# Patient Record
Sex: Female | Born: 1990 | Race: Black or African American | Hispanic: No | Marital: Single | Smoking: Never smoker
Health system: Southern US, Community
[De-identification: ages and names within clinical notes are randomized; demographics above are authoritative.]

## PROBLEM LIST (undated history)

## (undated) DIAGNOSIS — E669 Obesity, unspecified: Secondary | ICD-10-CM

## (undated) DIAGNOSIS — O1490 Unspecified pre-eclampsia, unspecified trimester: Secondary | ICD-10-CM

## (undated) DIAGNOSIS — T7840XA Allergy, unspecified, initial encounter: Secondary | ICD-10-CM

## (undated) HISTORY — DX: Allergy, unspecified, initial encounter: T78.40XA

## (undated) HISTORY — DX: Obesity, unspecified: E66.9

## (undated) HISTORY — DX: Unspecified pre-eclampsia, unspecified trimester: O14.90

---
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
## 2011-08-26 ENCOUNTER — Ambulatory Visit (INDEPENDENT_AMBULATORY_CARE_PROVIDER_SITE_OTHER): Payer: 59 | Admitting: Family Medicine

## 2011-08-26 VITALS — BP 120/80 | HR 76 | Temp 98.3°F | Resp 16 | Ht 65.75 in | Wt 207.8 lb

## 2011-08-26 DIAGNOSIS — IMO0001 Reserved for inherently not codable concepts without codable children: Secondary | ICD-10-CM

## 2011-08-26 DIAGNOSIS — Z309 Encounter for contraceptive management, unspecified: Secondary | ICD-10-CM

## 2011-08-26 LAB — POCT URINE PREGNANCY: Preg Test, Ur: NEGATIVE

## 2011-08-26 MED ORDER — MEDROXYPROGESTERONE ACETATE 150 MG/ML IM SUSP
150.0000 mg | Freq: Once | INTRAMUSCULAR | Status: AC
  Administered 2011-08-26: 150 mg via INTRAMUSCULAR

## 2011-08-26 NOTE — Progress Notes (Signed)
     Patient Name: Melanie Harrington Date of Birth: 1990/09/29 Medical Record Number: [id number] Gender: female Date of Encounter: 08/26/2011  History of Present Illness:  Melanie Harrington is a 21 y.o. very pleasant female patient who presents with the following:  Would like to start back on contraception.  She was on Depo- provera in the past- her last shot was in August of 2012.  She did well with this method but allowed it to lapse.  She is otherwise generally healthy. She does not smoke.  Here with her mother today- they are also interested in possibly getting implanon in the future and would like to find out more about this method.    She started her period today.    There is no problem list on file for this patient.  No past medical history on file. No past surgical history on file. History  Substance Use Topics  . Smoking status: Never Smoker   . Smokeless tobacco: Not on file  . Alcohol Use: Not on file   No family history on file. No Known Allergies  Medication list has been reviewed and updated.  Prior to Admission medications   Not on File    Review of Systems:  As per HPI- otherwise negative.   Physical Examination: Filed Vitals:   08/26/11 1752  BP: 120/80  Pulse: 76  Temp: 98.3 F (36.8 C)  Resp: 16   Filed Vitals:   08/26/11 1752  Height: 5' 5.75" (1.67 m)  Weight: 207 lb 12.8 oz (94.257 kg)   Body mass index is 33.80 kg/(m^2). Ideal Body Weight: Weight in (lb) to have BMI = 25: 153.4   GEN: WDWN, NAD, Non-toxic, A & O x 3, obese HEENT: Atraumatic, Normocephalic. Neck supple. No masses, No LAD. Ears and Nose: No external deformity. CV: RRR, No M/G/R. No JVD. No thrill. No extra heart sounds. PULM: CTA B, no wheezes, crackles, rhonchi. No retractions. No resp. distress. No accessory muscle use. EXTR: No c/c/e NEURO Normal gait.  PSYCH: Normally interactive. Conversant. Not depressed or anxious appearing.  Calm demeanor.   Results for orders  placed in visit on 08/26/11  POCT URINE PREGNANCY      Component Value Range   Preg Test, Ur Negative     She last had sex about a week ago, but has used condoms consistently.  No unprotected sex in the last month, started menses today, negative HCG today- ok to give Depo Assessment and Plan: 1. Birth control  POCT urine pregnancy, medroxyPROGESTERone (DEPO-PROVERA) injection 150 mg  gave Depo- provera today and instructed to use condoms as well, especially for the first 2 weeks.  Gave material to read about implanon- I can refer them to OB if they decide to use this method.     Abbe Amsterdam, MD

## 2011-10-13 ENCOUNTER — Ambulatory Visit (INDEPENDENT_AMBULATORY_CARE_PROVIDER_SITE_OTHER): Payer: 59 | Admitting: Physician Assistant

## 2011-10-13 VITALS — BP 123/80 | HR 76 | Temp 98.7°F | Resp 16 | Ht 65.25 in | Wt 205.0 lb

## 2011-10-13 DIAGNOSIS — E669 Obesity, unspecified: Secondary | ICD-10-CM | POA: Insufficient documentation

## 2011-10-13 DIAGNOSIS — J309 Allergic rhinitis, unspecified: Secondary | ICD-10-CM

## 2011-10-13 DIAGNOSIS — Z0000 Encounter for general adult medical examination without abnormal findings: Secondary | ICD-10-CM

## 2011-10-13 NOTE — Patient Instructions (Signed)
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
## 2012-10-05 ENCOUNTER — Ambulatory Visit (INDEPENDENT_AMBULATORY_CARE_PROVIDER_SITE_OTHER): Payer: 59 | Admitting: Emergency Medicine

## 2012-10-05 VITALS — BP 124/76 | HR 88 | Temp 98.0°F | Resp 18 | Ht 66.0 in | Wt 192.0 lb

## 2012-10-05 DIAGNOSIS — Z0000 Encounter for general adult medical examination without abnormal findings: Secondary | ICD-10-CM

## 2012-10-05 NOTE — Progress Notes (Signed)
[facility] [address] [place] [address] [phone]  Date:  10/05/2012   Name:  Melanie Harrington   DOB:  [birth year]   MRN:  [id number]  PCP:  Lucilla Edin, MD    Chief Complaint: Annual Exam   History of Present Illness:  Melanie Harrington is a 22 y.o. very pleasant female patient who presents with the following:  Wellness examination No medications.  Patient Active Problem List   Diagnosis Date Noted  . AR (allergic rhinitis) 10/13/2011  . Obesity     Past Medical History  Diagnosis Date  . Allergy   . Obesity     No past surgical history on file.  History  Substance Use Topics  . Smoking status: Never Smoker   . Smokeless tobacco: Never Used  . Alcohol Use: Yes     Comment: rarely, doesn't like it too much    Family History  Problem Relation Age of Onset  . Arthritis Mother   . Hyperlipidemia Father   . Hypertension Father   . Diabetes Father     No Known Allergies  Medication list has been reviewed and updated.  Current Outpatient Prescriptions on File Prior to Visit  Medication Sig Dispense Refill  . medroxyPROGESTERone (DEPO-PROVERA) 150 MG/ML injection Inject 150 mg into the muscle every 3 (three) months.       No current facility-administered medications on file prior to visit.    Review of Systems:  As per HPI, otherwise negative.    Physical Examination: Filed Vitals:   10/05/12 1023  BP: 124/76  Pulse: 88  Temp: 98 F (36.7 C)  Resp: 18   Filed Vitals:   10/05/12 1023  Height: 5\' 6"  (1.676 m)  Weight: 192 lb (87.091 kg)   Body mass index is 31 kg/(m^2). Ideal Body Weight: Weight in (lb) to have BMI = 25: 154.6  GEN: WDWN, NAD, Non-toxic, A & O x 3 HEENT: Atraumatic, Normocephalic. Neck supple. No masses, No LAD. Ears and Nose: No external deformity. CV: RRR, No M/G/R. No JVD. No thrill. No extra heart sounds. PULM: CTA B, no wheezes, crackles, rhonchi. No retractions. No resp. distress. No  accessory muscle use. ABD: S, NT, ND, +BS. No rebound. No HSM. EXTR: No c/c/e NEURO Normal gait.  PSYCH: Normally interactive. Conversant. Not depressed or anxious appearing.  Calm demeanor.    Assessment and Plan: Wellness examination   Signed,  Phillips Odor, MD

## 2013-10-06 ENCOUNTER — Ambulatory Visit (INDEPENDENT_AMBULATORY_CARE_PROVIDER_SITE_OTHER): Payer: 59 | Admitting: Family Medicine

## 2013-10-06 VITALS — BP 116/62 | HR 72 | Temp 98.5°F | Resp 18 | Ht 66.0 in | Wt 195.0 lb

## 2013-10-06 DIAGNOSIS — Z3009 Encounter for other general counseling and advice on contraception: Secondary | ICD-10-CM

## 2013-10-06 DIAGNOSIS — Z0000 Encounter for general adult medical examination without abnormal findings: Secondary | ICD-10-CM

## 2013-10-06 DIAGNOSIS — Z30011 Encounter for initial prescription of contraceptive pills: Secondary | ICD-10-CM

## 2013-10-06 LAB — POCT URINE PREGNANCY: Preg Test, Ur: NEGATIVE

## 2013-10-06 MED ORDER — NORGESTIMATE-ETH ESTRADIOL 0.25-35 MG-MCG PO TABS: 1.0000 | ORAL_TABLET | Freq: Every day | ORAL | Status: DC

## 2013-10-06 NOTE — Progress Notes (Signed)
Chief Complaint:  Chief Complaint  Patient presents with  . Annual Exam    no pap    HPI: Melanie Harrington is a 23 y.o. female who is here for  Band PE She plays the flugelhorn Needs PE for [employer] No CP, SOB, palpitations Has a history of allergies.  Does not want any labs.  She had pap 2015, was normal LMP 08/27/2013 Uses condoms all the times,was thinking about OCP, did not like depo because it caused her periods lasted a long time, she kept gaining weight.  G0, No prior h/o STDs     Past Medical History  Diagnosis Date  . Allergy   . Obesity    History reviewed. No pertinent past surgical history. History   Social History  . Marital Status: Single    Spouse Name: N/A    Number of Children: 0  . Years of Education: 14   Occupational History  . student     [employer]   Social History Main Topics  . Smoking status: Never Smoker   . Smokeless tobacco: Never Used  . Alcohol Use: Yes     Comment: rarely, doesn't like it too much  . Drug Use: No  . Sexual Activity: Yes    Partners: Male    Birth Control/ Protection: Condom, Injection   Other Topics Concern  . None   Social History Scientist, research (medical) in Nurse, children's at [employer].   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   Family History  Problem Relation Age of Onset  . Arthritis Mother   . Hyperlipidemia Father   . Hypertension Father   . Diabetes Father    No Known Allergies Prior to Admission medications   Medication Sig Start Date End Date Taking? Authorizing Provider  medroxyPROGESTERone (DEPO-PROVERA) 150 MG/ML injection Inject 150 mg into the muscle every 3 (three) months.    Historical Provider, MD     ROS: The patient denies fevers, chills, night sweats, unintentional weight loss, chest pain, palpitations, wheezing, dyspnea on exertion, nausea, vomiting, abdominal pain, dysuria, hematuria, melena, numbness, weakness, or tingling.   All other systems have been reviewed and were otherwise  negative with the exception of those mentioned in the HPI and as above.    PHYSICAL EXAM: Filed Vitals:   10/06/13 1037  BP: 116/62  Pulse: 72  Temp: 98.5 F (36.9 C)  Resp: 18   Filed Vitals:   10/06/13 1037  Height: 5\' 6"  (1.676 m)  Weight: 195 lb (88.451 kg)   Body mass index is 31.49 kg/(m^2).  General: Alert, no acute distress HEENT:  Normocephalic, atraumatic, oropharynx patent. EOMI, PERRLA. TM nl, no thryoidmegaly Cardiovascular:  Regular rate and rhythm, no rubs murmurs or gallops.  No Carotid bruits, radial pulse intact. No pedal edema.  Respiratory: Clear to auscultation bilaterally.  No wheezes, rales, or rhonchi.  No cyanosis, no use of accessory musculature GI: No organomegaly, abdomen is soft and non-tender, positive bowel sounds.  No masses. Skin: No rashes. Neurologic: Facial musculature symmetric. Psychiatric: Patient is appropriate throughout our interaction. Lymphatic: No cervical lymphadenopathy Musculoskeletal: Gait intact. 5/5 strength, 2/2 DTRs, neg scoliosis   LABS: Results for orders placed in visit on 10/06/13  POCT URINE PREGNANCY      Result Value Ref Range   Preg Test, Ur Negative       EKG/XRAY:   Primary read interpreted by Dr. Conley Rolls at [facility].   ASSESSMENT/PLAN: Encounter Diagnoses  Name Primary?  Marland Kitchen  Annual physical exam Yes  . Encounter for initial prescription of contraceptive pills    Normal PE Declined labs Sprintec birth control given F.u in 1 year  Gross sideeffects, risk and benefits, and alternatives of medications d/w patient. Patient is aware that all medications have potential sideeffects and we are unable to predict every sideeffect or drug-drug interaction that may occur.  Chonda Baney PHUONG, DO 10/06/2013 11:47 AM

## 2014-12-15 ENCOUNTER — Encounter: Payer: Self-pay | Admitting: Emergency Medicine

## 2015-03-19 ENCOUNTER — Emergency Department (HOSPITAL_COMMUNITY)
Admission: EM | Admit: 2015-03-19 | Discharge: 2015-03-19 | Disposition: A | Discharge status: Home / Self Care | Payer: No Typology Code available for payment source | Attending: Emergency Medicine | Admitting: Emergency Medicine

## 2015-03-19 ENCOUNTER — Encounter (HOSPITAL_COMMUNITY): Payer: Self-pay | Admitting: Emergency Medicine

## 2015-03-19 DIAGNOSIS — Y998 Other external cause status: Secondary | ICD-10-CM | POA: Diagnosis not present

## 2015-03-19 DIAGNOSIS — S39012A Strain of muscle, fascia and tendon of lower back, initial encounter: Secondary | ICD-10-CM | POA: Diagnosis not present

## 2015-03-19 DIAGNOSIS — Y9389 Activity, other specified: Secondary | ICD-10-CM | POA: Diagnosis not present

## 2015-03-19 DIAGNOSIS — Z793 Long term (current) use of hormonal contraceptives: Secondary | ICD-10-CM | POA: Diagnosis not present

## 2015-03-19 DIAGNOSIS — M25561 Pain in right knee: Secondary | ICD-10-CM

## 2015-03-19 DIAGNOSIS — Y92410 Unspecified street and highway as the place of occurrence of the external cause: Secondary | ICD-10-CM | POA: Insufficient documentation

## 2015-03-19 DIAGNOSIS — S0990XA Unspecified injury of head, initial encounter: Secondary | ICD-10-CM | POA: Diagnosis not present

## 2015-03-19 DIAGNOSIS — E669 Obesity, unspecified: Secondary | ICD-10-CM | POA: Diagnosis not present

## 2015-03-19 DIAGNOSIS — S3992XA Unspecified injury of lower back, initial encounter: Secondary | ICD-10-CM | POA: Diagnosis present

## 2015-03-19 DIAGNOSIS — S8991XA Unspecified injury of right lower leg, initial encounter: Secondary | ICD-10-CM | POA: Diagnosis not present

## 2015-03-19 MED ORDER — CYCLOBENZAPRINE HCL 10 MG PO TABS: 10.0000 mg | ORAL_TABLET | Freq: Two times a day (BID) | ORAL | Status: DC | PRN

## 2015-03-19 MED ORDER — NAPROXEN 500 MG PO TABS: 500.0000 mg | ORAL_TABLET | Freq: Two times a day (BID) | ORAL | Status: DC
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
## 2017-02-25 ENCOUNTER — Encounter (HOSPITAL_COMMUNITY): Payer: Self-pay | Admitting: *Deleted

## 2017-02-25 ENCOUNTER — Emergency Department (HOSPITAL_COMMUNITY)
Admission: EM | Admit: 2017-02-25 | Discharge: 2017-02-25 | Disposition: A | Discharge status: Home / Self Care | Payer: No Typology Code available for payment source | Attending: Emergency Medicine | Admitting: Emergency Medicine

## 2017-02-25 ENCOUNTER — Emergency Department (HOSPITAL_COMMUNITY): Payer: No Typology Code available for payment source

## 2017-02-25 DIAGNOSIS — M549 Dorsalgia, unspecified: Secondary | ICD-10-CM | POA: Diagnosis not present

## 2017-02-25 DIAGNOSIS — S0181XA Laceration without foreign body of other part of head, initial encounter: Secondary | ICD-10-CM | POA: Insufficient documentation

## 2017-02-25 DIAGNOSIS — M79642 Pain in left hand: Secondary | ICD-10-CM | POA: Diagnosis not present

## 2017-02-25 DIAGNOSIS — Y999 Unspecified external cause status: Secondary | ICD-10-CM | POA: Diagnosis not present

## 2017-02-25 DIAGNOSIS — M79644 Pain in right finger(s): Secondary | ICD-10-CM | POA: Diagnosis not present

## 2017-02-25 DIAGNOSIS — M542 Cervicalgia: Secondary | ICD-10-CM | POA: Diagnosis not present

## 2017-02-25 DIAGNOSIS — S0993XA Unspecified injury of face, initial encounter: Secondary | ICD-10-CM | POA: Diagnosis present

## 2017-02-25 DIAGNOSIS — Z79899 Other long term (current) drug therapy: Secondary | ICD-10-CM | POA: Insufficient documentation

## 2017-02-25 DIAGNOSIS — Y92410 Unspecified street and highway as the place of occurrence of the external cause: Secondary | ICD-10-CM | POA: Diagnosis not present

## 2017-02-25 DIAGNOSIS — Y9389 Activity, other specified: Secondary | ICD-10-CM | POA: Insufficient documentation

## 2017-02-25 MED ORDER — IBUPROFEN 600 MG PO TABS: 600.0000 mg | ORAL_TABLET | Freq: Four times a day (QID) | ORAL | 0 refills | Status: DC | PRN

## 2017-02-25 MED ORDER — METHOCARBAMOL 500 MG PO TABS: 500.0000 mg | ORAL_TABLET | Freq: Two times a day (BID) | ORAL | 0 refills | Status: DC

## 2017-02-25 MED ORDER — LIDOCAINE 5 % EX PTCH: 1.0000 | MEDICATED_PATCH | CUTANEOUS | 0 refills | Status: DC

## 2017-02-25 NOTE — Discharge Instructions (Addendum)
Expect your soreness to increase over the next 2-3 days. Take it easy, but do not lay around too much as this may make any stiffness worse.  Antiinflammatory medications: Take 600 mg of ibuprofen every 6 hours or 440 mg (over the counter dose) to 500 mg (prescription dose) of naproxen every 12 hours for the next 3 days. After this time, these medications may be used as needed for pain. Take these medications with food to avoid upset stomach. Choose only one of these medications, do not take them together.  Tylenol: Should you continue to have additional pain while taking the ibuprofen or naproxen, you may add in tylenol as needed. Your daily total maximum amount of tylenol from all sources should be limited to 4000mg /day for persons without liver problems, or 2000mg /day for those with liver problems. Muscle relaxer: Robaxin is a muscle relaxer and may help loosen stiff muscles. Do not take the Robaxin while driving or performing other dangerous activities.  Lidocaine patches: These are available via either prescription or over-the-counter. The over-the-counter option may be more economical one and are likely just as effective. There are multiple over-the-counter brands, such as Salonpas. Exercises: Be sure to perform the attached exercises starting with three times a week and working up to performing them daily. This is an essential part of preventing long term problems.   Please follow up with hand specialist for further management of the left hand injury.  Follow-up with a primary care provider for any further management of any of the other injuries.
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
## 2017-02-25 NOTE — ED Notes (Signed)
Patient transported to X-ray 
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
## 2023-01-12 LAB — OB RESULTS CONSOLE HEPATITIS B SURFACE ANTIGEN: Hepatitis B Surface Ag: NEGATIVE

## 2023-01-12 LAB — OB RESULTS CONSOLE GC/CHLAMYDIA
Chlamydia: NEGATIVE
Neisseria Gonorrhea: NEGATIVE

## 2023-01-12 LAB — OB RESULTS CONSOLE ANTIBODY SCREEN: Antibody Screen: NEGATIVE

## 2023-01-12 LAB — OB RESULTS CONSOLE RUBELLA ANTIBODY, IGM: Rubella: IMMUNE

## 2023-01-12 LAB — OB RESULTS CONSOLE HIV ANTIBODY (ROUTINE TESTING): HIV: NONREACTIVE

## 2023-01-12 LAB — OB RESULTS CONSOLE RPR: RPR: NONREACTIVE

## 2023-03-14 NOTE — L&D Delivery Note (Signed)
 Delivery Note    Patient Name: Melanie Harrington DOB: December 19, 1990 MRN: [id number]  Date of admission: 07/06/2023 Delivering MD: Ajai Terhaar , Thos Matsumoto Date of delivery: 07/07/2023 Type of delivery: SVD  Newborn Data: Live born female  Birth Weight:   APGAR: 7, 9  Newborn Delivery   Time head delivered: 07/07/2023 19:53:47 Birth date/time: 07/07/2023 19:52:52 Delivery type: Vaginal, Spontaneous     Kayna N Cathers, 33 y.o., @ 104w4d,  G1P0, who was admitted for PreE w/o SF, PCR 0.3, gbs-. I was called to the room when she progressed 2+ station in the second stage of labor.  She pushed for 30/min.  She delivered a viable infant, cephalic and restituted to the ROA position over an intact perineum.  A nuchal cord   was not identified. There was a slow decent of fetal head to station 5, after 15 second post delivery of head, shoulder dystocia was called, performed 10 seconds of posterior shoulder removal with out success, then 10 second of supra pubic with out success then 10 seconds of posterior shoulder with success for total of 30 second dystocia, newborn had equal strength bilat, and does not appear to have an broken humerus or clavicle, explained what happen with risk to parents and questions answered. The baby was placed on maternal abdomen while initial step of NRP were perfmored (Dry, Stimulated, and warmed). Hat placed on baby for thermoregulation. Delayed cord clamping was performed for 2 minutes.  Cord double clamped and cut.  Cord cut by FOB. Apgar scores were 7 and 9. Prophylactic Pitocin was started in the third stage of labor for active management. The placenta delivered spontaneously, shultz, with a 3 vessel cord and was sent to LD.  Inspection revealed none. An examination of the vaginal vault and cervix was free from lacerations. The uterus was firm, bleeding stable.   Placenta and umbilical artery blood gas were not sent.  There were no complications during the procedure.  Mom and baby skin to  skin following delivery. Left in stable condition. Plan for mag PP for 25 hours with repeat labs in morning.  BP 131/76   Pulse 97   Temp 98.8 F (37.1 C) (Oral)   Resp 18   Ht 5\' 7"  (1.702 m)   Wt 120.4 kg   SpO2 99%   BMI 41.57 kg/m    Maternal Info: Anesthesia: Epidural Episiotomy: no Lacerations:  no Suture Repair: no Est. Blood Loss (mL):   Newborn Info:  Baby Sex: female Circumcision: n/a  APGAR (1 MIN): 7  APGAR (5 MINS): 9  APGAR (10 MINS):     Mom to postpartum.  Baby to Couplet care / Skin to Skin.  Delivery Report:    Review the Delivery Report for details.   Alam Guterrez  CNM, FNP-C, PMHNP-BC  [id number]  [place], [place] [address]  Cell: [phone]  Office Phone: [phone] Fax: [phone] 07/07/2023  8:14 PM

## 2023-07-06 ENCOUNTER — Inpatient Hospital Stay (HOSPITAL_COMMUNITY)
Admission: AD | Admit: 2023-07-06 | Discharge: 2023-07-09 | DRG: 807 | Disposition: A | Discharge status: Home / Self Care | Payer: PRIVATE HEALTH INSURANCE | Attending: Obstetrics and Gynecology | Admitting: Obstetrics and Gynecology

## 2023-07-06 ENCOUNTER — Telehealth (HOSPITAL_COMMUNITY): Payer: Self-pay | Admitting: *Deleted

## 2023-07-06 ENCOUNTER — Encounter (HOSPITAL_COMMUNITY): Payer: Self-pay | Admitting: *Deleted

## 2023-07-06 ENCOUNTER — Encounter (HOSPITAL_COMMUNITY): Payer: Self-pay | Admitting: Obstetrics and Gynecology

## 2023-07-06 DIAGNOSIS — Z3A38 38 weeks gestation of pregnancy: Secondary | ICD-10-CM

## 2023-07-06 DIAGNOSIS — Z833 Family history of diabetes mellitus: Secondary | ICD-10-CM

## 2023-07-06 DIAGNOSIS — O1404 Mild to moderate pre-eclampsia, complicating childbirth: Secondary | ICD-10-CM | POA: Diagnosis not present

## 2023-07-06 DIAGNOSIS — O139 Gestational [pregnancy-induced] hypertension without significant proteinuria, unspecified trimester: Secondary | ICD-10-CM | POA: Diagnosis present

## 2023-07-06 DIAGNOSIS — O1490 Unspecified pre-eclampsia, unspecified trimester: Secondary | ICD-10-CM | POA: Diagnosis present

## 2023-07-06 DIAGNOSIS — Z8249 Family history of ischemic heart disease and other diseases of the circulatory system: Secondary | ICD-10-CM

## 2023-07-06 DIAGNOSIS — O1493 Unspecified pre-eclampsia, third trimester: Principal | ICD-10-CM

## 2023-07-06 DIAGNOSIS — O26893 Other specified pregnancy related conditions, third trimester: Secondary | ICD-10-CM | POA: Diagnosis not present

## 2023-07-06 DIAGNOSIS — O9902 Anemia complicating childbirth: Secondary | ICD-10-CM | POA: Diagnosis present

## 2023-07-06 DIAGNOSIS — Z23 Encounter for immunization: Secondary | ICD-10-CM

## 2023-07-06 LAB — PROTEIN / CREATININE RATIO, URINE
Creatinine, Urine: 105 mg/dL
Protein Creatinine Ratio: 0.31 mg/mg{creat} — ABNORMAL HIGH (ref 0.00–0.15)
Total Protein, Urine: 33 mg/dL

## 2023-07-06 LAB — CBC
HCT: 34.2 % — ABNORMAL LOW (ref 36.0–46.0)
Hemoglobin: 11.4 g/dL — ABNORMAL LOW (ref 12.0–15.0)
MCH: 28.1 pg (ref 26.0–34.0)
MCHC: 33.3 g/dL (ref 30.0–36.0)
MCV: 84.2 fL (ref 80.0–100.0)
Platelets: 299 10*3/uL (ref 150–400)
RBC: 4.06 MIL/uL (ref 3.87–5.11)
RDW: 14.9 % (ref 11.5–15.5)
WBC: 11.3 10*3/uL — ABNORMAL HIGH (ref 4.0–10.5)
nRBC: 0 % (ref 0.0–0.2)

## 2023-07-06 LAB — URINALYSIS, ROUTINE W REFLEX MICROSCOPIC
Bilirubin Urine: NEGATIVE
Glucose, UA: NEGATIVE mg/dL
Hgb urine dipstick: NEGATIVE
Ketones, ur: NEGATIVE mg/dL
Leukocytes,Ua: NEGATIVE
Nitrite: NEGATIVE
Protein, ur: 30 mg/dL — AB
Specific Gravity, Urine: 1.014 (ref 1.005–1.030)
pH: 7 (ref 5.0–8.0)

## 2023-07-06 LAB — COMPREHENSIVE METABOLIC PANEL WITH GFR
ALT: 18 U/L (ref 0–44)
AST: 21 U/L (ref 15–41)
Albumin: 2.5 g/dL — ABNORMAL LOW (ref 3.5–5.0)
Alkaline Phosphatase: 114 U/L (ref 38–126)
Anion gap: 8 (ref 5–15)
BUN: 6 mg/dL (ref 6–20)
CO2: 21 mmol/L — ABNORMAL LOW (ref 22–32)
Calcium: 8.8 mg/dL — ABNORMAL LOW (ref 8.9–10.3)
Chloride: 105 mmol/L (ref 98–111)
Creatinine, Ser: 0.55 mg/dL (ref 0.44–1.00)
GFR, Estimated: >60 mL/min (ref >60)
Glucose, Bld: 90 mg/dL (ref 70–99)
Potassium: 3.6 mmol/L (ref 3.5–5.1)
Sodium: 134 mmol/L — ABNORMAL LOW (ref 135–145)
Total Bilirubin: 0.2 mg/dL (ref 0.0–1.2)
Total Protein: 6 g/dL — ABNORMAL LOW (ref 6.5–8.1)

## 2023-07-06 LAB — POCT FERN TEST: POCT Fern Test: NEGATIVE

## 2023-07-06 LAB — WET PREP, GENITAL
Clue Cells Wet Prep HPF POC: NONE SEEN
Sperm: NONE SEEN
Trich, Wet Prep: NONE SEEN
WBC, Wet Prep HPF POC: <10 (ref <10)
Yeast Wet Prep HPF POC: NONE SEEN

## 2023-07-06 NOTE — MAU Note (Signed)
.  Melanie Harrington is a 33 y.o. at [redacted]w[redacted]d here in MAU reporting:   Since 2pm, pt states she has been feeling either cramps or ctxs but they are not stopping. Pt states they started off on her lower abdomen but now they are radiating down to her lower back.  4/10 back pain ctxs.  No bleeding.  Pt reports a couple of squirts come out at 1200, unsure if her water broke or Urine.  Fm+.  Vitals:   07/06/23 2145  BP: (!) 140/92  Pulse: 90  Resp: 17  Temp: 98.2 F (36.8 C)  SpO2: 99%    Has not been diagnosed with high blood pressure   FHT: 155 Lab orders placed from triage: Urine.

## 2023-07-06 NOTE — MAU Provider Note (Signed)
 Chief Complaint:  Contractions   HPI      Melanie Harrington is a 33 y.o. G1P0 at [redacted]w[redacted]d who presents to maternity admissions reporting contractions q 5-10 minutes and lower  back pain.  She denies any VB, reports one episode of LOF vs urine and feels good FM's. Her initial BP was elevated at 140/92 and she denies any h/o hypertension. Denies any HA's, visual changes, ruq pain, N/V and reports NO VB and good FM's .   Pregnancy Course: [facility]  Past Medical History:  Diagnosis Date   Allergy    Obesity    Pre-eclampsia    OB History  Gravida Para Term Preterm AB Living  1       SAB IAB Ectopic Multiple Live Births          # Outcome Date GA Lbr Len/2nd Weight Sex Type Anes PTL Lv  1 Current            History reviewed. No pertinent surgical history. Family History  Problem Relation Age of Onset   Diabetes Mother    Hyperlipidemia Mother    Arthritis Mother    Other Mother        lung transplant   Hyperlipidemia Father    Hypertension Father    Diabetes Father    Social History   Tobacco Use   Smoking status: Never   Smokeless tobacco: Never  Vaping Use   Vaping status: Never Used  Substance Use Topics   Alcohol use: Yes    Comment: rarely, doesn't like it too much   Drug use: No   No Known Allergies Medications Prior to Admission  Medication Sig Dispense Refill Last Dose/Taking   cetirizine (ZYRTEC) 10 MG chewable tablet Chew 10 mg by mouth daily.   07/06/2023   cholecalciferol (VITAMIN D3) 25 MCG (1000 UNIT) tablet Take 1,000 Units by mouth daily.   07/06/2023   Prenatal Vit-Fe Fumarate-FA (MULTIVITAMIN-PRENATAL) 27-0.8 MG TABS tablet Take 1 tablet by mouth daily at 12 noon.   07/06/2023   ibuprofen  (ADVIL ,MOTRIN ) 600 MG tablet Take 1 tablet (600 mg total) by mouth every 6 (six) hours as needed. 30 tablet 0 More than a month   lidocaine  (LIDODERM ) 5 % Place 1 patch onto the skin daily. Remove & Discard patch within 12 hours or as directed by MD 30 patch 0 More than a  month   methocarbamol  (ROBAXIN ) 500 MG tablet Take 1 tablet (500 mg total) by mouth 2 (two) times daily. 20 tablet 0 More than a month   norgestimate -ethinyl estradiol  (ORTHO-CYCLEN,SPRINTEC,PREVIFEM) 0.25-35 MG-MCG tablet Take 1 tablet by mouth daily. 1 Package 12 More than a month    I have reviewed patient's Past Medical Hx, Surgical Hx, Family Hx, Social Hx, medications and allergies.   ROS  Pertinent items noted in HPI and remainder of comprehensive ROS otherwise negative.   PHYSICAL EXAM  Patient Vitals for the past 24 hrs:  BP Temp Temp src Pulse Resp SpO2 Height Weight  07/07/23 0401 137/80 -- -- 74 -- -- -- --  07/07/23 0330 (!) 143/88 -- -- 81 -- -- -- --  07/07/23 0301 133/71 -- -- 79 -- -- -- --  07/07/23 0235 137/73 -- -- 78 -- -- -- --  07/07/23 0200 130/67 -- -- 79 -- -- -- --  07/07/23 0130 (!) 149/88 -- -- 81 -- -- -- --  07/07/23 0129 -- 98.1 F (36.7 C) Oral -- -- -- -- --  07/07/23 0117 (!) 151/95 -- --  21 -- -- -- --  07/07/23 0030 -- -- -- -- -- 99 % -- --  07/07/23 0025 -- -- -- -- -- 99 % -- --  07/07/23 0020 -- -- -- -- -- 98 % -- --  07/07/23 0015 (!) 148/87 -- -- 81 -- 99 % -- --  07/07/23 0010 -- -- -- -- -- 99 % -- --  07/07/23 0005 -- -- -- -- -- 99 % -- --  07/07/23 0000 (!) 144/88 -- -- 80 -- 99 % -- --  07/06/23 2345 (!) 149/89 -- -- 82 -- 99 % -- --  07/06/23 2330 (!) 149/91 -- -- 80 -- 98 % -- --  07/06/23 2300 (!) 156/93 -- -- 84 -- 100 % -- --  07/06/23 2250 -- -- -- -- -- 100 % -- --  07/06/23 2245 137/83 -- -- 83 -- 99 % -- --  07/06/23 2240 -- -- -- -- -- 100 % -- --  07/06/23 2230 (!) 139/91 -- -- 84 -- 99 % -- --  07/06/23 2220 -- -- -- -- -- 99 % -- --  07/06/23 2215 (!) 146/87 -- -- 83 -- 99 % -- --  07/06/23 2210 -- -- -- -- -- 99 % -- --  07/06/23 2200 (!) 135/90 -- -- 85 -- 99 % -- --  07/06/23 2150 -- -- -- -- -- 100 % -- --  07/06/23 2145 (!) 140/92 98.2 F (36.8 C) Oral 90 17 99 % -- --  07/06/23 2143 (!) 146/91 -- -- -- --  -- -- --  07/06/23 2139 -- -- -- -- -- -- 5\' 7"  (1.702 m) 120.4 kg    Constitutional: Well-developed, Obese  female in no acute distress.  Cardiovascular: normal rate & rhythm, warm and well-perfused, MRBP's Respiratory: normal effort, no problems with respiration noted GI: Abd soft, non-tender, gravid MS: Extremities nontender, no edema, normal ROM Neurologic: Alert and oriented x 4.  GU: no CVA tenderness Pelvic:    Dilation: 2.5 Effacement (%): 60 Cervical Position: Middle Station: -1 Presentation: Vertex Exam by:: Walda Guiles, RN  Fetal Tracing: Cat 1 reactive @ 2208 Baseline: 130-135 Variability:moderate   Accelerations: present Decelerations: absent Toco: irregular    Labs: Results for orders placed or performed during the hospital encounter of 07/06/23 (from the past 24 hours)  Fern Test     Status: Normal   Collection Time: 07/06/23 10:00 PM  Result Value Ref Range   POCT Fern Test Negative = intact amniotic membranes   Urinalysis, Routine w reflex microscopic -Urine, Clean Catch     Status: Abnormal   Collection Time: 07/06/23 10:02 PM  Result Value Ref Range   Color, Urine YELLOW YELLOW   APPearance HAZY (A) CLEAR   Specific Gravity, Urine 1.014 1.005 - 1.030   pH 7.0 5.0 - 8.0   Glucose, UA NEGATIVE NEGATIVE mg/dL   Hgb urine dipstick NEGATIVE NEGATIVE   Bilirubin Urine NEGATIVE NEGATIVE   Ketones, ur NEGATIVE NEGATIVE mg/dL   Protein, ur 30 (A) NEGATIVE mg/dL   Nitrite NEGATIVE NEGATIVE   Leukocytes,Ua NEGATIVE NEGATIVE   RBC / HPF 0-5 0 - 5 RBC/hpf   WBC, UA 0-5 0 - 5 WBC/hpf   Bacteria, UA RARE (A) NONE SEEN   Squamous Epithelial / HPF 6-10 0 - 5 /HPF   Mucus PRESENT   Wet prep, genital     Status: None   Collection Time: 07/06/23 10:15 PM  Result Value Ref Range   Yeast Wet  Prep HPF POC NONE SEEN NONE SEEN   Trich, Wet Prep NONE SEEN NONE SEEN   Clue Cells Wet Prep HPF POC NONE SEEN NONE SEEN   WBC, Wet Prep HPF POC <10 <10   Sperm NONE SEEN    CBC     Status: Abnormal   Collection Time: 07/06/23 10:41 PM  Result Value Ref Range   WBC 11.3 (H) 4.0 - 10.5 K/uL   RBC 4.06 3.87 - 5.11 MIL/uL   Hemoglobin 11.4 (L) 12.0 - 15.0 g/dL   HCT 84.6 (L) 96.2 - 95.2 %   MCV 84.2 80.0 - 100.0 fL   MCH 28.1 26.0 - 34.0 pg   MCHC 33.3 30.0 - 36.0 g/dL   RDW 84.1 32.4 - 40.1 %   Platelets 299 150 - 400 K/uL   nRBC 0.0 0.0 - 0.2 %  Comprehensive metabolic panel     Status: Abnormal   Collection Time: 07/06/23 10:41 PM  Result Value Ref Range   Sodium 134 (L) 135 - 145 mmol/L   Potassium 3.6 3.5 - 5.1 mmol/L   Chloride 105 98 - 111 mmol/L   CO2 21 (L) 22 - 32 mmol/L   Glucose, Bld 90 70 - 99 mg/dL   BUN 6 6 - 20 mg/dL   Creatinine, Ser 0.27 0.44 - 1.00 mg/dL   Calcium 8.8 (L) 8.9 - 10.3 mg/dL   Total Protein 6.0 (L) 6.5 - 8.1 g/dL   Albumin 2.5 (L) 3.5 - 5.0 g/dL   AST 21 15 - 41 U/L   ALT 18 0 - 44 U/L   Alkaline Phosphatase 114 38 - 126 U/L   Total Bilirubin 0.2 0.0 - 1.2 mg/dL   GFR, Estimated >25 >36 mL/min   Anion gap 8 5 - 15  Protein / creatinine ratio, urine     Status: Abnormal   Collection Time: 07/06/23 10:47 PM  Result Value Ref Range   Creatinine, Urine 105 mg/dL   Total Protein, Urine 33 mg/dL   Protein Creatinine Ratio 0.31 (H) 0.00 - 0.15 mg/mg[Cre]  Group B strep by PCR     Status: None   Collection Time: 07/07/23 12:23 AM   Specimen: Vaginal/Rectal; Genital  Result Value Ref Range   Group B strep by PCR PRESUMPTIVE NEGATIVE PRESUMPTIVE NEGATIVE  Type and screen [facility]     Status: None   Collection Time: 07/07/23 12:23 AM  Result Value Ref Range   ABO/RH(D) A POS    Antibody Screen NEG    Sample Expiration      07/10/2023,2359 Performed at [facility], [address] [place], [place] [address]       MDM & MAU COURSE  MDM:  HIGH- Admit for Preeclampsia without SF for IOL @ 38.4 GA MRBP's with PC ration of 0.31 - Discussed with Dr Wynona Hedger at [facility]  MAU Course: Orders  Placed This Encounter  Procedures   Wet prep, genital   Group B strep by PCR   Urinalysis, Routine w reflex microscopic -Urine, Clean Catch   CBC   Comprehensive metabolic panel   Protein / creatinine ratio, urine   RPR   Diet clear liquid Room service appropriate? Yes; Fluid consistency: Thin   SCDs   Vitals signs per unit policy   Notify physician (specify)   Fetal monitoring per unit policy   Activity as tolerated   Cervical Exam   Measure blood pressure post delivery every 15 min x 1 hour then every  30 min x 1 hour   Fundal check post delivery every 15 min x 1 hour then every 30 min x 1 hour   Apply Labor & Delivery Care Plan   Patient may have epidural placement upon request   If Rapid HIV test positive or known HIV positive: initiate AZT orders   May in and out cath x 2 for inability to void   Insert urethral catheter X 1 PRN If Coude Catheter is chosen, qualified resources by campus can be found in the clinical skills nursing procedure for Coude Catheter 1. If straight catheterized > 2 times or patient unable to void post epidural plac...   Refer to Sidebar Report Urinary (Foley) Catheter Indications   Refer to Sidebar Report Post Indwelling Urinary Catheter Removal and Intervention Guidelines   Discontinue foley prior to vaginal delivery   Initiate Oral Care Protocol   Initiate Carrier Fluid Protocol   Evaluate fetal heart rate to establish reassuring pattern prior to initiating Cytotec or Pitocin   Perform a cervical exam prior to initiating Cytotec or Pitocin   Discontinue Pitocin if tachysystole with non-reassuring FHR is present   Notify physician (specify) Tachysystole is defined as more than 5 contractions in a 10-minute time period averaged over a 30-minute window   Initiate intrauterine resuscitation if tachysystole with non-reassuring FHR is present   Notify physician (specify) Tachysystole is defined as more than 5 contractions in a 10-minute time period averaged  over a 30-minute window   May administer Terbutaline 0.25 mg SQ x 1 dose if tachysystole with non-reassuring FHR is present   Labor Induction   Evaluate fetal heart rate to establish reassuring pattern prior to initiating Cytotec or Pitocin   Perform a cervical exam prior to initiating Cytotec or Pitocin   Discontinue Pitocin if tachysystole with non-reassuring FHR is present   Notify physician (specify) Tachysystole is defined as more than 5 contractions in a 10-minute time period averaged over a 30-minute window   Initiate intrauterine resuscitation if tachysystole with non-reassuring FHR is present   Notify physician (specify) Tachysystole is defined as more than 5 contractions in a 10-minute time period averaged over a 30-minute window   May administer Terbutaline 0.25 mg SQ x 1 dose if tachysystole with non-reassuring FHR is present   Labor Induction   Full code   [employer]    Type and screen [facility]   Insert and maintain IV Line   Admit to Inpatient (patient's expected length of stay will be greater than 2 midnights or inpatient only procedure)   Meds ordered this encounter  Medications   lactated ringers infusion   oxytocin (PITOCIN) IV BOLUS FROM BAG   oxytocin (PITOCIN) IV infusion 30 units in NS 500 mL - Premix   lactated ringers infusion 500-1,000 mL   acetaminophen (TYLENOL) tablet 650 mg   fentaNYL (SUBLIMAZE) injection 50-100 mcg   ondansetron (ZOFRAN) injection 4 mg   sodium citrate-citric acid (ORACIT) solution 30 mL   lidocaine  (PF) (XYLOCAINE ) 1 % injection 30 mL   terbutaline (BRETHINE) injection 0.25 mg   DISCONTD: oxytocin (PITOCIN) IV infusion 30 units in NS 500 mL - Premix    Begin infusion at::   4 milli-unit/min (4 mL/hr)    Increase infusion by::   4 milli-unit/min (4 mL/hr)   DISCONTD: terbutaline (BRETHINE) injection 0.25 mg   oxytocin (PITOCIN) IV infusion 30 units in NS 500 mL - Premix    Begin infusion at::   2 milli-units/min (2  mL/hr)    Increase infusion by::   2 milli-units/min (2 mL/hr)      ASSESSMENT   1. Pre-eclampsia in third trimester     PLAN  Admit to LD as dw Dr Vallarie Gauze and Dr Wynona Hedger ( OB Attendings) - Routine admission orders placed  Debbe Fail, MSN, [facility], [facility]

## 2023-07-06 NOTE — Telephone Encounter (Signed)
 Preadmission screen

## 2023-07-07 ENCOUNTER — Encounter (HOSPITAL_COMMUNITY): Payer: Self-pay | Admitting: Obstetrics and Gynecology

## 2023-07-07 ENCOUNTER — Inpatient Hospital Stay (HOSPITAL_COMMUNITY): Payer: PRIVATE HEALTH INSURANCE | Admitting: Anesthesiology

## 2023-07-07 ENCOUNTER — Other Ambulatory Visit: Payer: Self-pay

## 2023-07-07 DIAGNOSIS — O139 Gestational [pregnancy-induced] hypertension without significant proteinuria, unspecified trimester: Secondary | ICD-10-CM | POA: Diagnosis present

## 2023-07-07 DIAGNOSIS — Z8249 Family history of ischemic heart disease and other diseases of the circulatory system: Secondary | ICD-10-CM | POA: Diagnosis not present

## 2023-07-07 DIAGNOSIS — Z833 Family history of diabetes mellitus: Secondary | ICD-10-CM | POA: Diagnosis not present

## 2023-07-07 DIAGNOSIS — Z23 Encounter for immunization: Secondary | ICD-10-CM | POA: Diagnosis not present

## 2023-07-07 DIAGNOSIS — O1490 Unspecified pre-eclampsia, unspecified trimester: Secondary | ICD-10-CM | POA: Diagnosis present

## 2023-07-07 DIAGNOSIS — O26893 Other specified pregnancy related conditions, third trimester: Secondary | ICD-10-CM | POA: Diagnosis present

## 2023-07-07 DIAGNOSIS — O9902 Anemia complicating childbirth: Secondary | ICD-10-CM | POA: Diagnosis present

## 2023-07-07 DIAGNOSIS — Z3A38 38 weeks gestation of pregnancy: Secondary | ICD-10-CM | POA: Diagnosis not present

## 2023-07-07 DIAGNOSIS — O1404 Mild to moderate pre-eclampsia, complicating childbirth: Secondary | ICD-10-CM | POA: Diagnosis present

## 2023-07-07 LAB — TYPE AND SCREEN
ABO/RH(D): A POS
Antibody Screen: NEGATIVE

## 2023-07-07 LAB — GROUP B STREP BY PCR: Group B strep by PCR: NEGATIVE

## 2023-07-07 MED ORDER — LIDOCAINE HCL (PF) 1 % IJ SOLN: 30.0000 mL | INTRAMUSCULAR | Status: DC | PRN

## 2023-07-07 MED ORDER — FENTANYL CITRATE (PF) 100 MCG/2ML IJ SOLN
50.0000 ug | INTRAMUSCULAR | Status: DC | PRN
  Administered 2023-07-07: 50 ug via INTRAVENOUS
  Administered 2023-07-07: 100 ug via INTRAVENOUS
  Filled 2023-07-07 (×2): qty 2

## 2023-07-07 MED ORDER — LIDOCAINE HCL (PF) 1 % IJ SOLN
INTRAMUSCULAR | Status: DC | PRN
  Administered 2023-07-07: 3 mL via EPIDURAL
  Administered 2023-07-07: 2 mL via EPIDURAL
  Administered 2023-07-07: 5 mL via EPIDURAL

## 2023-07-07 MED ORDER — EPHEDRINE 5 MG/ML INJ: 10.0000 mg | INTRAVENOUS | Status: DC | PRN

## 2023-07-07 MED ORDER — ONDANSETRON HCL 4 MG/2ML IJ SOLN: 4.0000 mg | INTRAMUSCULAR | Status: DC | PRN

## 2023-07-07 MED ORDER — BENZOCAINE-MENTHOL 20-0.5 % EX AERO: 1.0000 | INHALATION_SPRAY | CUTANEOUS | Status: DC | PRN

## 2023-07-07 MED ORDER — PHENYLEPHRINE 80 MCG/ML (10ML) SYRINGE FOR IV PUSH (FOR BLOOD PRESSURE SUPPORT): 80.0000 ug | PREFILLED_SYRINGE | INTRAVENOUS | Status: DC | PRN

## 2023-07-07 MED ORDER — ONDANSETRON HCL 4 MG/2ML IJ SOLN: 4.0000 mg | Freq: Four times a day (QID) | INTRAMUSCULAR | Status: DC | PRN

## 2023-07-07 MED ORDER — ACETAMINOPHEN 325 MG PO TABS: 650.0000 mg | ORAL_TABLET | ORAL | Status: DC | PRN

## 2023-07-07 MED ORDER — HYDRALAZINE HCL 20 MG/ML IJ SOLN: 5.0000 mg | INTRAMUSCULAR | Status: DC | PRN

## 2023-07-07 MED ORDER — OXYTOCIN-SODIUM CHLORIDE 30-0.9 UT/500ML-% IV SOLN
1.0000 m[IU]/min | INTRAVENOUS | Status: DC
  Administered 2023-07-07: 2 m[IU]/min via INTRAVENOUS
  Filled 2023-07-07: qty 500

## 2023-07-07 MED ORDER — LACTATED RINGERS IV SOLN: INTRAVENOUS | Status: DC

## 2023-07-07 MED ORDER — LABETALOL HCL 5 MG/ML IV SOLN: 40.0000 mg | INTRAVENOUS | Status: DC | PRN

## 2023-07-07 MED ORDER — LACTATED RINGERS IV SOLN: 500.0000 mL | Freq: Once | INTRAVENOUS | Status: DC

## 2023-07-07 MED ORDER — COCONUT OIL OIL: 1.0000 | TOPICAL_OIL | Status: DC | PRN

## 2023-07-07 MED ORDER — SENNOSIDES-DOCUSATE SODIUM 8.6-50 MG PO TABS
2.0000 | ORAL_TABLET | Freq: Every day | ORAL | Status: DC
  Administered 2023-07-08 – 2023-07-09 (×2): 2 via ORAL
  Filled 2023-07-07 (×2): qty 2

## 2023-07-07 MED ORDER — MAGNESIUM SULFATE 40 GM/1000ML IV SOLN
2.0000 g/h | INTRAVENOUS | Status: AC
  Administered 2023-07-07 – 2023-07-08 (×2): 2 g/h via INTRAVENOUS
  Filled 2023-07-07 (×2): qty 1000

## 2023-07-07 MED ORDER — DIPHENHYDRAMINE HCL 25 MG PO CAPS: 25.0000 mg | ORAL_CAPSULE | Freq: Four times a day (QID) | ORAL | Status: DC | PRN

## 2023-07-07 MED ORDER — LACTATED RINGERS IV SOLN
500.0000 mL | INTRAVENOUS | Status: DC | PRN
  Administered 2023-07-07: 500 mL via INTRAVENOUS

## 2023-07-07 MED ORDER — SIMETHICONE 80 MG PO CHEW: 80.0000 mg | CHEWABLE_TABLET | ORAL | Status: DC | PRN

## 2023-07-07 MED ORDER — TERBUTALINE SULFATE 1 MG/ML IJ SOLN: 0.2500 mg | Freq: Once | INTRAMUSCULAR | Status: DC | PRN

## 2023-07-07 MED ORDER — PRENATAL MULTIVITAMIN CH
1.0000 | ORAL_TABLET | Freq: Every day | ORAL | Status: DC
  Administered 2023-07-08 – 2023-07-09 (×2): 1 via ORAL
  Filled 2023-07-07 (×2): qty 1

## 2023-07-07 MED ORDER — ONDANSETRON HCL 4 MG PO TABS: 4.0000 mg | ORAL_TABLET | ORAL | Status: DC | PRN

## 2023-07-07 MED ORDER — WITCH HAZEL-GLYCERIN EX PADS: 1.0000 | MEDICATED_PAD | CUTANEOUS | Status: DC | PRN

## 2023-07-07 MED ORDER — ACETAMINOPHEN 325 MG PO TABS
650.0000 mg | ORAL_TABLET | ORAL | Status: DC | PRN
  Administered 2023-07-08 – 2023-07-09 (×3): 650 mg via ORAL
  Filled 2023-07-07 (×3): qty 2

## 2023-07-07 MED ORDER — DIBUCAINE (PERIANAL) 1 % EX OINT: 1.0000 | TOPICAL_OINTMENT | CUTANEOUS | Status: DC | PRN

## 2023-07-07 MED ORDER — MAGNESIUM SULFATE BOLUS VIA INFUSION
4.0000 g | Freq: Once | INTRAVENOUS | Status: AC
  Administered 2023-07-07: 4 g via INTRAVENOUS
  Filled 2023-07-07: qty 1000

## 2023-07-07 MED ORDER — LABETALOL HCL 5 MG/ML IV SOLN: 20.0000 mg | INTRAVENOUS | Status: DC | PRN

## 2023-07-07 MED ORDER — TETANUS-DIPHTH-ACELL PERTUSSIS 5-2.5-18.5 LF-MCG/0.5 IM SUSY
0.5000 mL | PREFILLED_SYRINGE | Freq: Once | INTRAMUSCULAR | Status: AC
  Administered 2023-07-08: 0.5 mL via INTRAMUSCULAR
  Filled 2023-07-07: qty 0.5

## 2023-07-07 MED ORDER — FENTANYL-BUPIVACAINE-NACL 0.5-0.125-0.9 MG/250ML-% EP SOLN
12.0000 mL/h | EPIDURAL | Status: DC | PRN
  Administered 2023-07-07: 12 mL/h via EPIDURAL
  Filled 2023-07-07: qty 250

## 2023-07-07 MED ORDER — OXYTOCIN-SODIUM CHLORIDE 30-0.9 UT/500ML-% IV SOLN
2.5000 [IU]/h | INTRAVENOUS | Status: DC
  Administered 2023-07-07: 2.5 [IU]/h via INTRAVENOUS

## 2023-07-07 MED ORDER — ZOLPIDEM TARTRATE 5 MG PO TABS: 5.0000 mg | ORAL_TABLET | Freq: Every evening | ORAL | Status: DC | PRN

## 2023-07-07 MED ORDER — DIPHENHYDRAMINE HCL 50 MG/ML IJ SOLN
12.5000 mg | INTRAMUSCULAR | Status: DC | PRN
  Administered 2023-07-07: 12.5 mg via INTRAVENOUS
  Filled 2023-07-07: qty 1

## 2023-07-07 MED ORDER — HYDRALAZINE HCL 20 MG/ML IJ SOLN: 10.0000 mg | INTRAMUSCULAR | Status: DC | PRN

## 2023-07-07 MED ORDER — SOD CITRATE-CITRIC ACID 500-334 MG/5ML PO SOLN: 30.0000 mL | ORAL | Status: DC | PRN

## 2023-07-07 MED ORDER — SODIUM CHLORIDE 0.9 % IV SOLN: INTRAVENOUS | Status: AC

## 2023-07-07 MED ORDER — OXYTOCIN BOLUS FROM INFUSION
333.0000 mL | Freq: Once | INTRAVENOUS | Status: AC
  Administered 2023-07-07: 333 mL via INTRAVENOUS

## 2023-07-07 MED ORDER — OXYTOCIN-SODIUM CHLORIDE 30-0.9 UT/500ML-% IV SOLN: 1.0000 m[IU]/min | INTRAVENOUS | Status: DC

## 2023-07-07 MED ORDER — IBUPROFEN 600 MG PO TABS
600.0000 mg | ORAL_TABLET | Freq: Four times a day (QID) | ORAL | Status: DC
  Administered 2023-07-08 (×4): 600 mg via ORAL
  Filled 2023-07-07 (×6): qty 1

## 2023-07-07 NOTE — Progress Notes (Signed)
 This RN made OB anaesthesiologist aware that labs were drawn at 22:45 on 07/06/2023. Dr. Denese Finn said he would proceed.

## 2023-07-07 NOTE — H&P (Signed)
 Melanie Harrington is a 32 y.o. female G1P0 presented to MAU for labor check. She was found to have elevated mild range bp and pcr of 0.3 meeting criteria for preeclampsia.  She denies headache visual disturbances or ruq pain. Pregnancy complicated by maternal obesity.  She has irregular contractions, no lof , no vaginal bleeding. +FM.   Prenatal care provided by [facility].   OB History     Gravida  1   Para      Term      Preterm      AB      Living         SAB      IAB      Ectopic      Multiple      Live Births             Past Medical History:  Diagnosis Date   Allergy    Obesity    Pre-eclampsia    History reviewed. No pertinent surgical history. Family History: family history includes Arthritis in her mother; Diabetes in her father and mother; Hyperlipidemia in her father and mother; Hypertension in her father; Other in her mother. Social History:  reports that she has never smoked. She has never used smokeless tobacco. She reports current alcohol use. She reports that she does not use drugs.     Maternal Diabetes: No Genetic Screening: unknown Maternal Ultrasounds/Referrals: Normal Fetal Ultrasounds or other Referrals:  None Maternal Substance Abuse:  No Significant Maternal Medications:  None Significant Maternal Lab Results:  Group B Strep negative Number of Prenatal Visits:greater than 3 verified prenatal visits Maternal Vaccinations:unknown Other Comments:  None  Review of Systems  Constitutional: Negative.   HENT: Negative.    Eyes: Negative.   Respiratory: Negative.    Cardiovascular: Negative.   Gastrointestinal: Negative.   Endocrine: Negative.   Genitourinary: Negative.   Musculoskeletal: Negative.   Skin: Negative.   Allergic/Immunologic: Negative.   Neurological: Negative.   Hematological: Negative.   Psychiatric/Behavioral: Negative.     History Dilation: 2.5 Effacement (%): 60 Station: -1 Exam by:: Walda Guiles, RN Blood pressure 133/71, pulse 79, temperature 98.1 F (36.7 C), temperature source Oral, resp. rate 17, height 5\' 7"  (1.702 m), weight 120.4 kg, SpO2 99%. Maternal Exam:  Introitus: Normal vulva.   Physical Exam Vitals reviewed.  Constitutional:      Appearance: Normal appearance.  HENT:     Head: Normocephalic and atraumatic.     Nose: Nose normal.     Mouth/Throat:     Mouth: Mucous membranes are moist.  Cardiovascular:     Rate and Rhythm: Normal rate and regular rhythm.  Pulmonary:     Effort: Pulmonary effort is normal.     Breath sounds: Normal breath sounds.  Abdominal:     Tenderness: There is no abdominal tenderness.  Genitourinary:    General: Normal vulva.  Musculoskeletal:        General: Normal range of motion.     Cervical back: Normal range of motion and neck supple.     Right lower leg: Edema present.     Left lower leg: Edema present.  Neurological:     General: No focal deficit present.     Mental Status: She is alert and oriented to person, place, and time.  Psychiatric:        Mood and Affect: Mood normal.        Behavior: Behavior normal.  Prenatal labs: ABO, Rh: --/--/A POS (04/26 0023) Antibody: NEG (04/26 0023) Rubella: Immune (11/01 0000) RPR: Nonreactive (11/01 0000)  HBsAg: Negative (11/01 0000)  HIV: Non-reactive (11/01 0000)  GBS: PRESUMPTIVE NEGATIVE/-- (04/26 0023)   Results for orders placed or performed during the hospital encounter of 07/06/23 (from the past 24 hours)  Fern Test     Status: Normal   Collection Time: 07/06/23 10:00 PM  Result Value Ref Range   POCT Fern Test Negative = intact amniotic membranes   Urinalysis, Routine w reflex microscopic -Urine, Clean Catch     Status: Abnormal   Collection Time: 07/06/23 10:02 PM  Result Value Ref Range   Color, Urine YELLOW YELLOW   APPearance HAZY (A) CLEAR   Specific Gravity, Urine 1.014 1.005 - 1.030   pH 7.0 5.0 - 8.0   Glucose, UA NEGATIVE NEGATIVE mg/dL    Hgb urine dipstick NEGATIVE NEGATIVE   Bilirubin Urine NEGATIVE NEGATIVE   Ketones, ur NEGATIVE NEGATIVE mg/dL   Protein, ur 30 (A) NEGATIVE mg/dL   Nitrite NEGATIVE NEGATIVE   Leukocytes,Ua NEGATIVE NEGATIVE   RBC / HPF 0-5 0 - 5 RBC/hpf   WBC, UA 0-5 0 - 5 WBC/hpf   Bacteria, UA RARE (A) NONE SEEN   Squamous Epithelial / HPF 6-10 0 - 5 /HPF   Mucus PRESENT   Wet prep, genital     Status: None   Collection Time: 07/06/23 10:15 PM  Result Value Ref Range   Yeast Wet Prep HPF POC NONE SEEN NONE SEEN   Trich, Wet Prep NONE SEEN NONE SEEN   Clue Cells Wet Prep HPF POC NONE SEEN NONE SEEN   WBC, Wet Prep HPF POC <10 <10   Sperm NONE SEEN   CBC     Status: Abnormal   Collection Time: 07/06/23 10:41 PM  Result Value Ref Range   WBC 11.3 (H) 4.0 - 10.5 K/uL   RBC 4.06 3.87 - 5.11 MIL/uL   Hemoglobin 11.4 (L) 12.0 - 15.0 g/dL   HCT 13.0 (L) 86.5 - 78.4 %   MCV 84.2 80.0 - 100.0 fL   MCH 28.1 26.0 - 34.0 pg   MCHC 33.3 30.0 - 36.0 g/dL   RDW 69.6 29.5 - 28.4 %   Platelets 299 150 - 400 K/uL   nRBC 0.0 0.0 - 0.2 %  Comprehensive metabolic panel     Status: Abnormal   Collection Time: 07/06/23 10:41 PM  Result Value Ref Range   Sodium 134 (L) 135 - 145 mmol/L   Potassium 3.6 3.5 - 5.1 mmol/L   Chloride 105 98 - 111 mmol/L   CO2 21 (L) 22 - 32 mmol/L   Glucose, Bld 90 70 - 99 mg/dL   BUN 6 6 - 20 mg/dL   Creatinine, Ser 1.32 0.44 - 1.00 mg/dL   Calcium 8.8 (L) 8.9 - 10.3 mg/dL   Total Protein 6.0 (L) 6.5 - 8.1 g/dL   Albumin 2.5 (L) 3.5 - 5.0 g/dL   AST 21 15 - 41 U/L   ALT 18 0 - 44 U/L   Alkaline Phosphatase 114 38 - 126 U/L   Total Bilirubin 0.2 0.0 - 1.2 mg/dL   GFR, Estimated >44 >01 mL/min   Anion gap 8 5 - 15  Protein / creatinine ratio, urine     Status: Abnormal   Collection Time: 07/06/23 10:47 PM  Result Value Ref Range   Creatinine, Urine 105 mg/dL   Total Protein, Urine 33 mg/dL  Protein Creatinine Ratio 0.31 (H) 0.00 - 0.15 mg/mg[Cre]  Group B strep by PCR      Status: None   Collection Time: 07/07/23 12:23 AM   Specimen: Vaginal/Rectal; Genital  Result Value Ref Range   Group B strep by PCR PRESUMPTIVE NEGATIVE PRESUMPTIVE NEGATIVE  Type and screen [facility]     Status: None   Collection Time: 07/07/23 12:23 AM  Result Value Ref Range   ABO/RH(D) A POS    Antibody Screen NEG    Sample Expiration      07/10/2023,2359 Performed at [facility], [address] [place], [place] [address]     Assessment/Plan: 38 weeks and 4 days with preeclampsia without severe features.  -Admit to labor and delivery  - Plan magnesium post delivery or if she develops severe features.  -Pitocin for induction  - Fetal well being - Category 1  - Pain control per patient preference.  - Anticipate SVD   Arlee Lace 07/07/2023, 3:38 AM

## 2023-07-07 NOTE — MAU Note (Signed)
 Pt informed that the ultrasound is considered a limited OB ultrasound and is not intended to be a complete ultrasound exam.  Patient also informed that the ultrasound is not being completed with the intent of assessing for fetal or placental anomalies or any pelvic abnormalities.  Explained that the purpose of today's ultrasound is to assess for  presentation.  Patient acknowledges the purpose of the exam and the limitations of the study.     Verified Vertex by Provider Debbe Fail, NP

## 2023-07-07 NOTE — Anesthesia Procedure Notes (Signed)
 Epidural Patient location during procedure: OB Start time: 07/07/2023 6:03 AM End time: 07/07/2023 6:10 AM  Staffing Anesthesiologist: Erin Havers, MD Performed: anesthesiologist   Preanesthetic Checklist Completed: patient identified, IV checked, risks and benefits discussed, monitors and equipment checked, pre-op evaluation and timeout performed  Epidural Patient position: sitting Prep: DuraPrep Patient monitoring: blood pressure and continuous pulse ox Approach: midline Location: L3-L4 Injection technique: LOR air  Needle:  Needle type: Tuohy  Needle gauge: 17 G Needle length: 9 cm Needle insertion depth: 7 cm Catheter size: 19 Gauge Catheter at skin depth: 12 cm Test dose: negative and Other (1% Lidocaine )  Additional Notes Patient identified.  Risk benefits discussed including failed block, incomplete pain control, headache, nerve damage, paralysis, blood pressure changes, nausea, vomiting, reactions to medication both toxic or allergic, and postpartum back pain.  Patient expressed understanding and wished to proceed.  All questions were answered.  Sterile technique used throughout procedure and epidural site dressed with sterile barrier dressing. No paresthesia or other complications noted. The patient did not experience any signs of intravascular injection such as tinnitus or metallic taste in mouth nor signs of intrathecal spread such as rapid motor block. Please see nursing notes for vital signs. Reason for block:procedure for pain

## 2023-07-07 NOTE — Progress Notes (Signed)
  Subjective: Patient is comfortable with her epidural. SOme LOF. +FM no vaginal bleeding. She denies headache visual disturbances or ruq pain.   Objective: BP 132/73   Pulse (!) 102   Temp 98.4 F (36.9 C) (Oral)   Resp 20   Ht 5\' 7"  (1.702 m)   Wt 120.4 kg   SpO2 99%   BMI 41.57 kg/m  No intake/output data recorded. Total I/O In: -  Out: 450 [Urine:450]  FHT:  FHR: 120 bpm, variability: moderate,  accelerations:  Present,  decelerations:  Absent UC:   regular, every 2 minutes SVE:   Dilation: 8 Effacement (%): 90 Station: 0 Exam by:: dr Wynona Hedger  Labs: Lab Results  Component Value Date   WBC 11.3 (H) 07/06/2023   HGB 11.4 (L) 07/06/2023   HCT 34.2 (L) 07/06/2023   MCV 84.2 07/06/2023   PLT 299 07/06/2023    Assessment / Plan: Induction of labor due to preeclampsia,  progressing well on pitocin  Labor: Progressing normally Preeclampsia:  labs stable Fetal Wellbeing:  Category I Pain Control:  Epidural I/D:   NA Anticipated MOD:  NSVD  Melanie Lace, MD 07/07/2023, 4:19 PM

## 2023-07-07 NOTE — Plan of Care (Signed)

## 2023-07-07 NOTE — Anesthesia Preprocedure Evaluation (Addendum)
 Anesthesia Evaluation  Patient identified by MRN, date of birth, ID band Patient awake    Reviewed: Allergy & Precautions, NPO status , Patient's Chart, lab work & pertinent test results  Airway Mallampati: III  TM Distance: >3 FB Neck ROM: Full    Dental  (+) Teeth Intact, Dental Advisory Given   Pulmonary neg pulmonary ROS   Pulmonary exam normal breath sounds clear to auscultation       Cardiovascular hypertension (Pre-E), Normal cardiovascular exam Rhythm:Regular Rate:Normal     Neuro/Psych negative neurological ROS  negative psych ROS   GI/Hepatic negative GI ROS, Neg liver ROS,,,  Endo/Other  negative endocrine ROS    Renal/GU negative Renal ROS     Musculoskeletal negative musculoskeletal ROS (+)    Abdominal   Peds  Hematology  (+) Blood dyscrasia, anemia Plt 299k   Anesthesia Other Findings Day of surgery medications reviewed with the patient.  Reproductive/Obstetrics (+) Pregnancy                             Anesthesia Physical Anesthesia Plan  ASA: 3  Anesthesia Plan: Epidural   Post-op Pain Management:    Induction:   PONV Risk Score and Plan: 2 and Treatment may vary due to age or medical condition  Airway Management Planned: Natural Airway  Additional Equipment:   Intra-op Plan:   Post-operative Plan:   Informed Consent: I have reviewed the patients History and Physical, chart, labs and discussed the procedure including the risks, benefits and alternatives for the proposed anesthesia with the patient or authorized representative who has indicated his/her understanding and acceptance.     Dental advisory given  Plan Discussed with:   Anesthesia Plan Comments: (Patient identified. Risks/Benefits/Options discussed with patient including but not limited to bleeding, infection, nerve damage, paralysis, failed block, incomplete pain control, headache, blood  pressure changes, nausea, vomiting, reactions to medication both or allergic, itching and postpartum back pain. Confirmed with bedside nurse the patient's most recent platelet count. Confirmed with patient that they are not currently taking any anticoagulation, have any bleeding history or any family history of bleeding disorders. Patient expressed understanding and wished to proceed. All questions were answered. )        Anesthesia Quick Evaluation

## 2023-07-08 LAB — COMPREHENSIVE METABOLIC PANEL WITH GFR
ALT: 17 U/L (ref 0–44)
AST: 24 U/L (ref 15–41)
Albumin: 2.2 g/dL — ABNORMAL LOW (ref 3.5–5.0)
Alkaline Phosphatase: 113 U/L (ref 38–126)
Anion gap: 11 (ref 5–15)
BUN: <5 mg/dL — ABNORMAL LOW (ref 6–20)
CO2: 21 mmol/L — ABNORMAL LOW (ref 22–32)
Calcium: 8 mg/dL — ABNORMAL LOW (ref 8.9–10.3)
Chloride: 103 mmol/L (ref 98–111)
Creatinine, Ser: 0.61 mg/dL (ref 0.44–1.00)
GFR, Estimated: >60 mL/min (ref >60)
Glucose, Bld: 106 mg/dL — ABNORMAL HIGH (ref 70–99)
Potassium: 3.4 mmol/L — ABNORMAL LOW (ref 3.5–5.1)
Sodium: 135 mmol/L (ref 135–145)
Total Bilirubin: 0.3 mg/dL (ref 0.0–1.2)
Total Protein: 5.6 g/dL — ABNORMAL LOW (ref 6.5–8.1)

## 2023-07-08 LAB — CBC
HCT: 33.2 % — ABNORMAL LOW (ref 36.0–46.0)
Hemoglobin: 10.8 g/dL — ABNORMAL LOW (ref 12.0–15.0)
MCH: 27.6 pg (ref 26.0–34.0)
MCHC: 32.5 g/dL (ref 30.0–36.0)
MCV: 84.9 fL (ref 80.0–100.0)
Platelets: 299 10*3/uL (ref 150–400)
RBC: 3.91 MIL/uL (ref 3.87–5.11)
RDW: 15 % (ref 11.5–15.5)
WBC: 14 10*3/uL — ABNORMAL HIGH (ref 4.0–10.5)
nRBC: 0 % (ref 0.0–0.2)

## 2023-07-08 LAB — MAGNESIUM: Magnesium: 3.3 mg/dL — ABNORMAL HIGH (ref 1.7–2.4)

## 2023-07-08 MED ORDER — NIFEDIPINE ER OSMOTIC RELEASE 30 MG PO TB24
30.0000 mg | ORAL_TABLET | Freq: Every day | ORAL | Status: DC
  Administered 2023-07-08 – 2023-07-09 (×2): 30 mg via ORAL
  Filled 2023-07-08 (×2): qty 1

## 2023-07-08 NOTE — Anesthesia Postprocedure Evaluation (Signed)
 Anesthesia Post Note  Patient: Melanie Harrington  Procedure(s) Performed: AN AD HOC LABOR EPIDURAL     Patient location during evaluation: OB High Risk Anesthesia Type: Epidural Level of consciousness: awake and alert Pain management: pain level controlled Vital Signs Assessment: post-procedure vital signs reviewed and stable Respiratory status: spontaneous breathing, nonlabored ventilation and respiratory function stable Cardiovascular status: stable Postop Assessment: no headache, no backache, epidural receding, no apparent nausea or vomiting, patient able to bend at knees, able to ambulate and adequate PO intake Anesthetic complications: no   No notable events documented.  Last Vitals:  Vitals:   07/08/23 0710 07/08/23 0825  BP:  (!) 147/83  Pulse:  81  Resp: 18 18  Temp:  36.9 C  SpO2:  100%    Last Pain:  Vitals:   07/08/23 0825  TempSrc: Oral  PainSc:    Pain Goal:                   Rodarius Kichline Hristova

## 2023-07-08 NOTE — Lactation Note (Signed)
 This note was copied from a baby's chart. Lactation Consultation Note  Patient Name: Melanie Harrington Today's Date: 07/08/2023 Age: 32 hrs Reason for consult: Initial assessment;1st time breastfeeding;Primapara;Early term 37-38.6wks  P1, 38 wks, @ 15 hrs of life. LC entered room- RN getting ready to feed baby. Demonstrating trying to wake baby. Demonstrated cross-cradle positioning on left breast, football positioning on right. Infant too sleepy to latch. Mom flat breasted @ today's visit- nipple does evert with stimulation of pumping cycle.  Demonstrated steps of latching- if baby were participating. Encouraged parents that sleepy first day is very normal. Completed pumping cycle with mom, per mom she had done once before in the night with a staff member as well. Highlighted breast stimulation is tied directly to milk production, encouraged pumping every 3 hrs. Discussed expectations @ breast- Day 1- sleepy/ feed every 3 hrs/ even 10 minutes is okay, Day 2 more awake/ feeding cues/longer feeds, and cluster feeding overnights brings milk in. LC services and milk storage shared. Encouraged mom to call for assist anytime desired.  ** unsure if [facility] will bring stork pump- requested stork pump**   Maternal Data Does the patient have breastfeeding experience prior to this delivery?: No  Feeding Mother's Current Feeding Choice: Breast Milk and Formula Nipple Type: Nfant Standard Flow (white)  LATCH Score Latch: Too sleepy or reluctant, no latch achieved, no sucking elicited.  Audible Swallowing: None  Type of Nipple: Flat  Comfort (Breast/Nipple): Soft / non-tender  Hold (Positioning): Full assist, staff holds infant at breast  LATCH Score: 3   Lactation Tools Discussed/Used Tools: Pump Breast pump type: Double-Electric Breast Pump Pump Education: Setup, frequency, and cleaning;Milk Storage Reason for Pumping: Breast stimulation/ protect milk supply coming in Pumping  frequency: Every 3 hrs if baby not coming to breast  Interventions Interventions: Breast feeding basics reviewed;Education;LC Services brochure;CDC milk storage guidelines  Discharge    Consult Status Consult Status: Follow-up Date: 07/09/23 Follow-up type: In-patient    Juleen Sorrels 07/08/2023, 1:16 PM

## 2023-07-08 NOTE — Progress Notes (Signed)
 Subjective: Postpartum Day # 1 : S/P NSVD due to pt was admitted on 4/26 for IOL for new onset Pre w/o SF PCR was 0.3, unremarkable labs and asymptomatic, Bp currently 132/83, labs remain stable, progressed with AROM and pitocin to SVD on 4/26 over intact perineum with wbl of , gbs-, had 30 second shoulder dystocia t birth with ni deficits noted. Hgb droip of 11.4-10.4, asymptomatic. Currently on PP mag. . Patient up ad lib, denies syncope or dizziness. Reports consuming regular diet without issues and denies N/V. Patient reports o bowel movement + passing flatus.  Denies issues with urination and reports bleeding is "lighter."  Patient is BR/BT feeding and reports going well.  Desires undecided for postpartum contraception.  Pain is being appropriately managed with use of po meds. Denies HA, RUQ pain or vision changes. .   no laceration Feeding:  Br/BT Contraceptive plan:  undecided   Objective: Vital signs in last 24 hours: Patient Vitals for the past 24 hrs:  BP Temp Temp src Pulse Resp SpO2  07/08/23 0537 137/79 -- -- 83 18 97 %  07/08/23 0400 (!) 143/80 -- -- 88 20 99 %  07/08/23 0300 (!) 153/87 -- -- 96 16 97 %  07/08/23 0200 (!) 148/89 -- -- 91 18 99 %  07/08/23 0100 136/79 -- -- 93 18 99 %  07/08/23 0000 (!) 157/84 -- -- 90 20 --  07/07/23 2300 (!) 155/93 -- -- 91 20 98 %  07/07/23 2231 (!) 152/91 98.4 F (36.9 C) Oral 98 20 97 %  07/07/23 2200 138/80 -- -- (!) 101 -- --  07/07/23 2145 134/80 -- -- 93 -- --  07/07/23 2130 110/83 -- -- (!) 134 -- --  07/07/23 2100 (!) 157/84 -- -- 99 -- --  07/07/23 2030 (!) 144/87 -- -- (!) 102 -- --  07/07/23 2015 131/76 -- -- 97 -- --  07/07/23 2000 (!) 134/93 -- -- (!) 110 -- --  07/07/23 1930 (!) 156/91 -- -- (!) 104 -- --  07/07/23 1926 -- 98.8 F (37.1 C) Oral -- 18 --  07/07/23 1900 138/83 -- -- 94 -- --  07/07/23 1850 (!) 147/91 98.6 F (37 C) Oral 93 20 --  07/07/23 1800 123/74 -- -- 94 18 --  07/07/23 1730 126/82 -- --  97 20 --  07/07/23 1702 136/84 -- -- 89 18 --  07/07/23 1701 136/84 -- -- 89 -- --  07/07/23 1631 136/79 98 F (36.7 C) Oral 89 18 --  07/07/23 1605 136/80 -- -- 97 20 --  07/07/23 1530 132/73 98.4 F (36.9 C) Oral (!) 102 20 --  07/07/23 1500 135/85 -- -- (!) 102 18 --  07/07/23 1437 -- 98 F (36.7 C) Oral -- 20 --  07/07/23 1435 -- -- -- -- -- 99 %  07/07/23 1431 132/72 -- -- 88 18 --  07/07/23 1430 -- -- -- -- -- 98 %  07/07/23 1425 -- -- -- -- -- 97 %  07/07/23 1420 -- -- -- -- -- 97 %  07/07/23 1415 -- -- -- -- -- 98 %  07/07/23 1410 -- -- -- -- -- 96 %  07/07/23 1405 -- -- -- -- -- 97 %  07/07/23 1400 129/71 -- -- 87 18 97 %  07/07/23 1355 -- -- -- -- -- 97 %  07/07/23 1350 -- -- -- -- -- 97 %  07/07/23 1349 -- -- -- -- -- 97 %  07/07/23 1345 -- -- -- -- --  97 %  07/07/23 1340 -- -- -- -- -- 97 %  07/07/23 1335 -- -- -- -- -- 98 %  07/07/23 1331 129/76 -- -- 97 -- --  07/07/23 1300 (!) 146/90 98.2 F (36.8 C) Oral 91 18 --  07/07/23 1231 (!) 145/92 -- -- 92 -- --  07/07/23 1200 (!) 150/93 98.2 F (36.8 C) Oral 93 18 --  07/07/23 1130 (!) 145/95 -- -- 93 20 --  07/07/23 1100 (!) 143/90 -- -- 89 18 --  07/07/23 1030 (!) 142/89 -- -- 93 (!) 22 --  07/07/23 0930 (!) 140/87 98 F (36.7 C) Oral 93 20 99 %  07/07/23 0901 139/79 -- -- 81 18 --  07/07/23 0835 -- -- -- -- -- 99 %  07/07/23 0831 136/80 -- -- 80 -- --  07/07/23 0805 -- -- -- -- -- 99 %  07/07/23 0801 137/83 -- -- 81 (!) 22 --  07/07/23 0730 (!) 143/83 -- -- 79 (!) 22 99 %  07/07/23 0700 (!) 144/85 -- -- 83 -- 99 %  07/07/23 0650 139/85 -- -- 82 -- 99 %  07/07/23 0645 139/87 -- -- 80 -- 99 %  07/07/23 0641 139/82 -- -- 82 -- --  07/07/23 0635 137/85 -- -- 92 -- 99 %  07/07/23 0631 (!) 142/84 -- -- 84 -- --  07/07/23 0626 (!) 144/85 -- -- 83 -- --  07/07/23 0621 (!) 145/88 -- -- 88 -- --     Physical Exam:  General: alert, cooperative, and appears stated age Mood/Affect: happy Lungs: clear to  auscultation, no wheezes, rales or rhonchi, symmetric air entry.  Heart: normal rate, regular rhythm, normal S1, S2, no murmurs, rubs, clicks or gallops. Breast: breasts appear normal, no suspicious masses, no skin or nipple changes or axillary nodes. Abdomen:  + bowel sounds, soft, non-tender GU: perineum intact, healing well. No signs of external hematomas.  Uterine Fundus: firm Lochia: appropriate Skin: Warm, Dry. DVT Evaluation: No evidence of DVT seen on physical exam. Negative Homan's sign. No cords or calf tenderness. Calf/Ankle edema is present.     Latest Ref Rng & Units 07/08/2023    5:19 AM 07/06/2023   10:41 PM  CBC  WBC 4.0 - 10.5 K/uL 14.0  11.3   Hemoglobin 12.0 - 15.0 g/dL 14.7  82.9   Hematocrit 36.0 - 46.0 % 33.2  34.2   Platelets 150 - 400 K/uL 299  299     Results for orders placed or performed during the hospital encounter of 07/06/23 (from the past 24 hours)  CBC     Status: Abnormal   Collection Time: 07/08/23  5:19 AM  Result Value Ref Range   WBC 14.0 (H) 4.0 - 10.5 K/uL   RBC 3.91 3.87 - 5.11 MIL/uL   Hemoglobin 10.8 (L) 12.0 - 15.0 g/dL   HCT 56.2 (L) 13.0 - 86.5 %   MCV 84.9 80.0 - 100.0 fL   MCH 27.6 26.0 - 34.0 pg   MCHC 32.5 30.0 - 36.0 g/dL   RDW 78.4 69.6 - 29.5 %   Platelets 299 150 - 400 K/uL   nRBC 0.0 0.0 - 0.2 %     CBG (last 3)  No results for input(s): "GLUCAP" in the last 72 hours.   I/O last 3 completed shifts: In: -  Out: 450 [Urine:450]   Assessment Postpartum Day # 1 : S/P NSVD due to pt was admitted on 4/26 for IOL for new onset Pre w/o  SF PCR was 0.3, unremarkable labs and asymptomatic, Bp currently 132/83, labs remain stable, progressed with AROM and pitocin to SVD on 4/26 over intact perineum with wbl of , gbs-, had 30 second shoulder dystocia t birth with no deficits noted. Hgb drop of 11.4-10.4, asymptomatic. Currently on PP mag, level this morning was 3.3.  Pt stable. -1 involution. Br/Bt feeding. Hemodynamically  stable.   Plan: Continue other mgmt as ordered PreE w/o sf: monitor BP, mag to be stopped at 2200 tonight.  VTE prophylactics: Early ambulated as tolerates.  Pain control: Motrin /Tylenol PRN Education given regarding options for contraception, including barrier methods, injectable contraception, IUD placement, oral contraceptives.  Plan for discharge tomorrow, Breastfeeding, and Lactation consult  Dr. Monico Anna to be updated on patient status  Sukhraj Esquivias  CNM, FNP-C, PMHNP-BC  [id number]  [place], [place] [address]  Cell: [phone]  Office Phone: [phone] Fax: [phone] 07/08/2023  6:19 AM

## 2023-07-09 ENCOUNTER — Other Ambulatory Visit (HOSPITAL_COMMUNITY): Payer: Self-pay

## 2023-07-09 LAB — GC/CHLAMYDIA PROBE AMP (~~LOC~~) NOT AT ARMC
Chlamydia: NEGATIVE
Comment: NEGATIVE
Comment: NORMAL
Neisseria Gonorrhea: NEGATIVE

## 2023-07-09 LAB — RPR: RPR Ser Ql: NONREACTIVE

## 2023-07-09 MED ORDER — NIFEDIPINE ER OSMOTIC RELEASE 60 MG PO TB24: 60.0000 mg | ORAL_TABLET | Freq: Every day | ORAL | Status: DC

## 2023-07-09 MED ORDER — IBUPROFEN 600 MG PO TABS
600.0000 mg | ORAL_TABLET | Freq: Four times a day (QID) | ORAL | 0 refills | Status: AC
  Filled 2023-07-09: qty 30, 8d supply, fill #0

## 2023-07-09 MED ORDER — POTASSIUM CHLORIDE CRYS ER 20 MEQ PO TBCR
20.0000 meq | EXTENDED_RELEASE_TABLET | Freq: Two times a day (BID) | ORAL | Status: DC
  Administered 2023-07-09: 20 meq via ORAL
  Filled 2023-07-09: qty 1

## 2023-07-09 MED ORDER — NIFEDIPINE ER OSMOTIC RELEASE 30 MG PO TB24
30.0000 mg | ORAL_TABLET | Freq: Every day | ORAL | Status: AC
  Administered 2023-07-09: 30 mg via ORAL
  Filled 2023-07-09: qty 1

## 2023-07-09 MED ORDER — NIFEDIPINE ER 60 MG PO TB24
60.0000 mg | ORAL_TABLET | Freq: Every day | ORAL | 0 refills | Status: AC
  Filled 2023-07-09: qty 30, 30d supply, fill #0

## 2023-07-09 MED ORDER — FUROSEMIDE 20 MG PO TABS
20.0000 mg | ORAL_TABLET | Freq: Once | ORAL | Status: AC
  Administered 2023-07-09: 20 mg via ORAL
  Filled 2023-07-09: qty 1

## 2023-07-09 MED ORDER — POTASSIUM CHLORIDE CRYS ER 20 MEQ PO TBCR
20.0000 meq | EXTENDED_RELEASE_TABLET | Freq: Two times a day (BID) | ORAL | 0 refills | Status: AC
  Filled 2023-07-09: qty 6, 3d supply, fill #0

## 2023-07-09 NOTE — Discharge Instructions (Signed)
 Ronna, 1. Do not do any heavy lifting, i.e nothing heavier than 15 lbs for the next 6 weeks.  2.  Do not use tampons or douche or take baths, do not have any sexual intercourse or anything inside the vagina for the next 6 weeks.  3. Take your pain medication as needed for pain, let us  know if the pain is not well controlled despite pain medication use.   4.  If you get a fever while at home, do check your temperature and if it is equal to or greater than 100.4 please call the office.   5. Some vaginal bleeding is expected and normal after your delivery. Please let us  know if if it excessive where you saturate 1 pad in less than 2 hours or so.  6. Please let us  know if with depression or anxiety symptoms, or symptoms of uncontrolled blood pressure such as headache, vision changes, nausea, vomiting, chest pain, shortness of breath.   [facility] [phone].

## 2023-07-09 NOTE — Discharge Summary (Signed)
 Postpartum Discharge Summary   Patient Name: Melanie Harrington DOB: 1990/07/26 MRN: [id number]  Date of admission: 07/06/2023 Delivery date:07/07/2023 Delivering provider: [place] , JADE Date of discharge: 07/09/2023  Admitting diagnosis: Gestational hypertension [O13.9] Intrauterine pregnancy: [redacted]w[redacted]d     Secondary diagnosis:  Principal Problem:   Gestational hypertension Active Problems:   Preeclampsia   SVD (spontaneous vaginal delivery)   Shoulder dystocia during labor and delivery    Discharge diagnosis: Term Pregnancy Delivered                                              Post partum procedures: Magnesium sulfate for 24 hours postpartum.  Augmentation: AROM and Pitocin Complications: None  Hospital course: Induction of Labor With Vaginal Delivery   33 y.o. yo G1P1001 at [redacted]w[redacted]d was admitted to the hospital 07/06/2023 for induction of labor.  Indication for induction: Preeclampsia.  Patient had a normal labor course.  Membrane Rupture Time/Date: 9:30 AM,07/07/2023  Delivery Method:Vaginal, Spontaneous Operative Delivery:N/A Episiotomy: None Lacerations:    Details of delivery can be found in separate delivery note.  Patient had a postpartum course significant for receiving 24 hours of postpartum magnesium sulfate for  preeclampsia without severe features. She also had elevated blood pressure for which she received Nifedipine XL, low potassium for which she received oral potassium and lower extremity edema (3+ bilaterally) for which she received one oral dose of lasix.  Patient is discharged home 07/09/23.  Newborn Data: Birth date:07/07/2023 Birth time:7:52 PM Gender:Female Living status:Living Apgars:7 ,9  Weight:3130 g  Magnesium Sulfate received: Yes: Seizure prophylaxis BMZ received: No Rhophylac:N/A Immunizations received: Immunization History  Administered Date(s) Administered   DTaP 01/01/1992, 05/07/1992, 02/21/1993, 10/20/1996, 03/15/2002   HIB (PRP-OMP)  01/01/1992, 03/15/1992, 05/07/1992, 02/21/1993   HPV Quadrivalent 06/12/2006, 02/21/2007, 10/26/2008   Hepatitis B 06[birth year]10[birth year]91, 01/01/1992, 05/07/1992   IPV 01/01/1992, 03/15/1992, 02/21/1993, 10/20/1996   MMR 02/21/1993   Meningococcal polysaccharide vaccine (MPSV4) 04/09/2006   Tdap 06/12/2006, 07/08/2023   Varicella 05/11/1997    Physical exam  Vitals:   07/09/23 0532 07/09/23 0839 07/09/23 1151 07/09/23 1527  BP: 139/84 139/88 (!) 142/75 126/71  Pulse: 90 80 82 85  Resp: 16 17 18 16   Temp: 98.2 F (36.8 C) 98.2 F (36.8 C) 98.4 F (36.9 C) 98.5 F (36.9 C)  TempSrc: Oral Oral Oral Oral  SpO2: 100% 99% 99% 99%  Weight:      Height:       General: alert, cooperative, and no distress Lochia: appropriate Uterine Fundus: firm Incision: N/A DVT Evaluation: No evidence of DVT seen on physical exam. Calf/Ankle edema is present Labs: Lab Results  Component Value Date   WBC 14.0 (H) 07/08/2023   HGB 10.8 (L) 07/08/2023   HCT 33.2 (L) 07/08/2023   MCV 84.9 07/08/2023   PLT 299 07/08/2023      Latest Ref Rng & Units 07/08/2023    5:19 AM  CMP  Glucose 70 - 99 mg/dL 409   BUN 6 - 20 mg/dL <5   Creatinine 8.11 - 1.00 mg/dL 9.14   Sodium 782 - 956 mmol/L 135   Potassium 3.5 - 5.1 mmol/L 3.4   Chloride 98 - 111 mmol/L 103   CO2 22 - 32 mmol/L 21   Calcium 8.9 - 10.3 mg/dL 8.0   Total Protein 6.5 - 8.1 g/dL 5.6   Total Bilirubin  0.0 - 1.2 mg/dL 0.3   Alkaline Phos 38 - 126 U/L 113   AST 15 - 41 U/L 24   ALT 0 - 44 U/L 17    Edinburgh Score:    07/08/2023    1:00 PM  Edinburgh Postnatal Depression Scale Screening Tool  I have been able to laugh and see the funny side of things. 0  I have looked forward with enjoyment to things. 0  I have blamed myself unnecessarily when things went wrong. 1  I have been anxious or worried for no good reason. 0  I have felt scared or panicky for no good reason. 1  Things have been getting on top of me. 2  I have been so unhappy  that I have had difficulty sleeping. 0  I have felt sad or miserable. 0  I have been so unhappy that I have been crying. 1  The thought of harming myself has occurred to me. 0  Edinburgh Postnatal Depression Scale Total 5   Edinburgh Postnatal Depression Scale Total: 5   After visit meds:  Allergies as of 07/09/2023   No Known Allergies      Medication List     STOP taking these medications    cetirizine 10 MG chewable tablet Commonly known as: ZYRTEC   cholecalciferol 25 MCG (1000 UNIT) tablet Commonly known as: VITAMIN D3   lidocaine  5 % Commonly known as: Lidoderm    methocarbamol  500 MG tablet Commonly known as: ROBAXIN    multivitamin-prenatal 27-0.8 MG Tabs tablet   norgestimate -ethinyl estradiol  0.25-35 MG-MCG tablet Commonly known as: ORTHO-CYCLEN       TAKE these medications    ibuprofen  600 MG tablet Commonly known as: ADVIL  Take 1 tablet (600 mg total) by mouth every 6 (six) hours. What changed:  when to take this reasons to take this   NIFEdipine 60 MG 24 hr tablet Commonly known as: ADALAT CC Take 1 tablet (60 mg total) by mouth daily for 40 doses. Start taking on: July 10, 2023   potassium chloride SA 20 MEQ tablet Commonly known as: KLOR-CON M Take 1 tablet (20 mEq total) by mouth 2 (two) times daily for 3 days. Swallow tablets whole. DO NOT crush, chew or suck on tablet. You may break tablet in half and swallow each half separately with a glass of water. Whole tablet may be dissolved in 4 ounces of water, allow 2 minutes to dissolve, then stir for half minute. Swirl and drink all the liquid right away.       Discharge home in stable condition Infant Feeding: Breast Infant Disposition:home with mother Discharge instruction: per After Visit Summary and Postpartum booklet. Activity: Advance as tolerated. Pelvic rest for 6 weeks.  Diet: routine diet Future Appointments:No future appointments. Follow up Visit:  Follow-up Information      [facility], [facility]. Schedule an appointment as soon as possible for a visit in 1 week(s).   Specialty: Obstetrics and Gynecology Why: 1 week for blood pressure check. 6 weeks for postpartum check. Contact information: [address] [place] [address] [phone]               Anticipated Birth Control:  IUD.  07/09/2023 Charlott Converse, MD.

## 2023-07-11 ENCOUNTER — Inpatient Hospital Stay (HOSPITAL_COMMUNITY): Payer: PRIVATE HEALTH INSURANCE

## 2023-07-11 ENCOUNTER — Inpatient Hospital Stay (HOSPITAL_COMMUNITY)
Admission: AD | Admit: 2023-07-11 | Payer: PRIVATE HEALTH INSURANCE | Source: Home / Self Care | Admitting: Obstetrics and Gynecology

## 2023-07-19 ENCOUNTER — Telehealth (HOSPITAL_COMMUNITY): Payer: Self-pay | Admitting: *Deleted

## 2023-07-19 NOTE — Telephone Encounter (Signed)
 07/19/2023  Name: Melanie Harrington MRN: [id number] DOB: Mar 31, 1990  Reason for Call:  Transition of Care Hospital Discharge Call  Contact Status: Patient Contact Status: Message  Language assistant needed:          Follow-Up Questions:    Dimple Francis Postnatal Depression Scale:  In the Past 7 Days:    PHQ2-9 Depression Scale:     Discharge Follow-up:    Post-discharge interventions: NA  Pearlie Bougie, RN 07/19/2023 18:31

## 2023-10-08 ENCOUNTER — Ambulatory Visit (INDEPENDENT_AMBULATORY_CARE_PROVIDER_SITE_OTHER)

## 2023-10-08 ENCOUNTER — Ambulatory Visit (HOSPITAL_COMMUNITY)
Admission: EM | Admit: 2023-10-08 | Discharge: 2023-10-08 | Disposition: A | Discharge status: Home / Self Care | Attending: Internal Medicine | Admitting: Internal Medicine

## 2023-10-08 DIAGNOSIS — M62838 Other muscle spasm: Secondary | ICD-10-CM

## 2023-10-08 DIAGNOSIS — M542 Cervicalgia: Secondary | ICD-10-CM

## 2023-10-08 MED ORDER — CYCLOBENZAPRINE HCL 5 MG PO TABS: 5.0000 mg | ORAL_TABLET | Freq: Three times a day (TID) | ORAL | 0 refills | Status: AC | PRN
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
## 2023-10-08 NOTE — Discharge Instructions (Addendum)
 X-ray done of the cervical spine.  Final evaluation by the radiologist is still pending but on brief evaluation there does not appear to be any acute fractures.  After the radiologist has read the x-ray, if there is any changes to the treatment plan we will contact you.  Symptoms are most likely secondary to muscular injury and muscular spasm.  We will treat this with anti-inflammatories as well as muscle relaxers.  We will treat with the following: Flexeril  5 mg every 8 hours as needed for muscle spasms.  Use caution as this medication can cause drowsiness.  Ibuprofen  600 to 800 mg every 6 hours as needed for pain May alternate heat and ice on the area to improve symptoms Light stretching to improve mobility If symptoms fail to resolve in the next 5 to 7 days or if symptoms worsen return to urgent care
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
## 2024-04-14 ENCOUNTER — Ambulatory Visit: Payer: PRIVATE HEALTH INSURANCE | Admitting: Family Medicine

## 2024-05-23 ENCOUNTER — Ambulatory Visit: Admitting: Family Medicine
